# Patient Record
Sex: Male | Born: 2000 | Race: Black or African American | Hispanic: No | Marital: Single | Smoking: Never smoker
Health system: Southern US, Community
[De-identification: ages and names within clinical notes are randomized; demographics above are authoritative.]

---
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
## 2021-04-28 ENCOUNTER — Ambulatory Visit (HOSPITAL_COMMUNITY)
Admission: EM | Admit: 2021-04-28 | Discharge: 2021-04-28 | Disposition: A | Discharge status: Home / Self Care | Payer: Medicaid Other | Attending: Family Medicine | Admitting: Family Medicine

## 2021-04-28 ENCOUNTER — Encounter (HOSPITAL_COMMUNITY): Payer: Self-pay | Admitting: Emergency Medicine

## 2021-04-28 ENCOUNTER — Other Ambulatory Visit: Payer: Self-pay

## 2021-04-28 DIAGNOSIS — J101 Influenza due to other identified influenza virus with other respiratory manifestations: Secondary | ICD-10-CM | POA: Diagnosis not present

## 2021-04-28 DIAGNOSIS — B349 Viral infection, unspecified: Secondary | ICD-10-CM

## 2021-04-28 MED ORDER — FAMOTIDINE 20 MG PO TABS: 20.0000 mg | ORAL_TABLET | Freq: Two times a day (BID) | ORAL | 0 refills | Status: AC

## 2021-04-28 MED ORDER — OSELTAMIVIR PHOSPHATE 75 MG PO CAPS: 75.0000 mg | ORAL_CAPSULE | Freq: Two times a day (BID) | ORAL | 0 refills | Status: AC

## 2021-04-28 MED ORDER — ONDANSETRON 4 MG PO TBDP: 4.0000 mg | ORAL_TABLET | Freq: Three times a day (TID) | ORAL | 0 refills | Status: AC | PRN

## 2021-04-28 NOTE — ED Provider Notes (Signed)
[facility]    CSN: [id number] Arrival date & time: 04/28/21  4332      History   Chief Complaint Chief Complaint  Patient presents with   Headache   Sore Throat   Abdominal Pain    HPI Jacob Nguyen is a 20 y.o. male.   HPI Patient presents today with 2 days of cough, fever, abdominal ache, sore throat, nausea with vomiting, poor appetite and headache.  Fever had been in the upper 100s he has been taken Tylenol for management of fever.  No known exposure to anyone sick.  Symptoms initially started out as vomiting.  He also endorses some achiness of his extremities.  Denies any difficulty breathing.  History reviewed. No pertinent past medical history.  There are no problems to display for this patient.   History reviewed. No pertinent surgical history.     Home Medications    Prior to Admission medications   Not on File    Family History Family History  Problem Relation Age of Onset   Cancer Mother     Social History Social History   Tobacco Use   Smoking status: Never   Smokeless tobacco: Never  Vaping Use   Vaping Use: Never used  Substance Use Topics   Alcohol use: Never     Allergies   Patient has no known allergies.   Review of Systems Review of Systems Pertinent negatives listed in HPI   Physical Exam Triage Vital Signs ED Triage Vitals  Enc Vitals Group     BP 04/28/21 1153 122/85     Pulse Rate 04/28/21 1156 99     Resp 04/28/21 1156 17     Temp 04/28/21 1156 98.7 F (37.1 C)     Temp Source 04/28/21 1156 Oral     SpO2 04/28/21 1156 100 %     Weight --      Height --      Head Circumference --      Peak Flow --      Pain Score 04/28/21 1156 8     Pain Loc --      Pain Edu? --      Excl. in GC? --    No data found.  Updated Vital Signs BP 122/85 (BP Location: Right Arm)   Pulse 99   Temp 98.7 F (37.1 C) (Oral)   Resp 17   SpO2 100%   Visual Acuity Right Eye Distance:   Left Eye Distance:    Bilateral Distance:    Right Eye Near:   Left Eye Near:    Bilateral Near:     Physical Exam Constitutional:      Appearance: He is well-developed. He is ill-appearing.  HENT:     Head: Normocephalic.  Eyes:     Extraocular Movements: Extraocular movements intact.     Pupils: Pupils are equal, round, and reactive to light.  Cardiovascular:     Rate and Rhythm: Normal rate and regular rhythm.  Pulmonary:     Effort: Pulmonary effort is normal.     Breath sounds: Normal breath sounds.  Abdominal:     Palpations: Abdomen is soft.  Musculoskeletal:     Cervical back: Normal range of motion.  Neurological:     Mental Status: He is alert.     GCS: GCS eye subscore is 4. GCS verbal subscore is 5. GCS motor subscore is 6.  Psychiatric:        Mood and Affect: Mood normal.  Behavior: Behavior normal.     UC Treatments / Results  Labs (all labs ordered are listed, but only abnormal results are displayed) Labs Reviewed  POC INFLUENZA A AND B ANTIGEN (URGENT CARE ONLY)    EKG   Radiology No results found.  Procedures Procedures (including critical care time)  Medications Ordered in UC Medications - No data to display  Initial Impression / Assessment and Plan / UC Course  I have reviewed the triage vital signs and the nursing notes.  Pertinent labs & imaging results that were available during my care of the patient were reviewed by me and considered in my medical decision making (see chart for details).     Influenza A viral illness Treatment per discharge medication orders.  Red flag precautions given the patient which warrant immediate follow-up in the setting of the emergency department.  Continue Tylenol or ibuprofen as needed for fever management.  Return precautions given.  Final Clinical Impressions(s) / UC Diagnoses   Final diagnoses:  Influenza A  Viral illness   Discharge Instructions   None    ED Prescriptions   None    PDMP not  reviewed this encounter.   Bing Neighbors, FNP 04/28/21 1325

## 2021-04-28 NOTE — ED Triage Notes (Signed)
Patient c/o headache, sore throat, and ABD pain x 2 days.   Patient endorses chills and RT arm aching.   Patient endorses emesis that started yesterday. Patient endorses diarrhea that started today, "liquid" consistency.   Patient has taken TheraFlu and Tylenol with no relief of symptoms.

## 2021-04-28 NOTE — Discharge Instructions (Addendum)
Your flu test is positive. Start Tamiflu today and complete entire course of treatment. Take tylenol or Ibuprofen for body aches  Pepcid 20 mg twice daily as needed for abdominal pain symptoms
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
